# Patient Record
Sex: Female | Born: 1970 | Race: White | Hispanic: No | Marital: Single | Smoking: Current every day smoker
Health system: Southern US, Community
[De-identification: ages and names within clinical notes are randomized; demographics above are authoritative.]

---

## 2004-07-15 ENCOUNTER — Emergency Department: Payer: Self-pay | Admitting: Emergency Medicine

## 2005-07-15 ENCOUNTER — Other Ambulatory Visit: Payer: Self-pay

## 2005-07-15 ENCOUNTER — Emergency Department: Payer: Self-pay | Admitting: Emergency Medicine

## 2007-02-21 IMAGING — CR DG CHEST 2V
1 series · 2 of 2 positions shown · non-contrast
Comparison: none

REASON FOR EXAM: Chest pain
COMMENTS:

PROCEDURE:     DXR - DXR CHEST PA (OR AP) AND LATERAL  - July 15, 2005  [DATE]
RESULT:     The lungs are clear. The cardiac silhouette and visualized bony
skeleton are unremarkable.

[Series 1: view not recorded · 0.17mm/px · 2 of 2 slices shown]
[im 1/2]
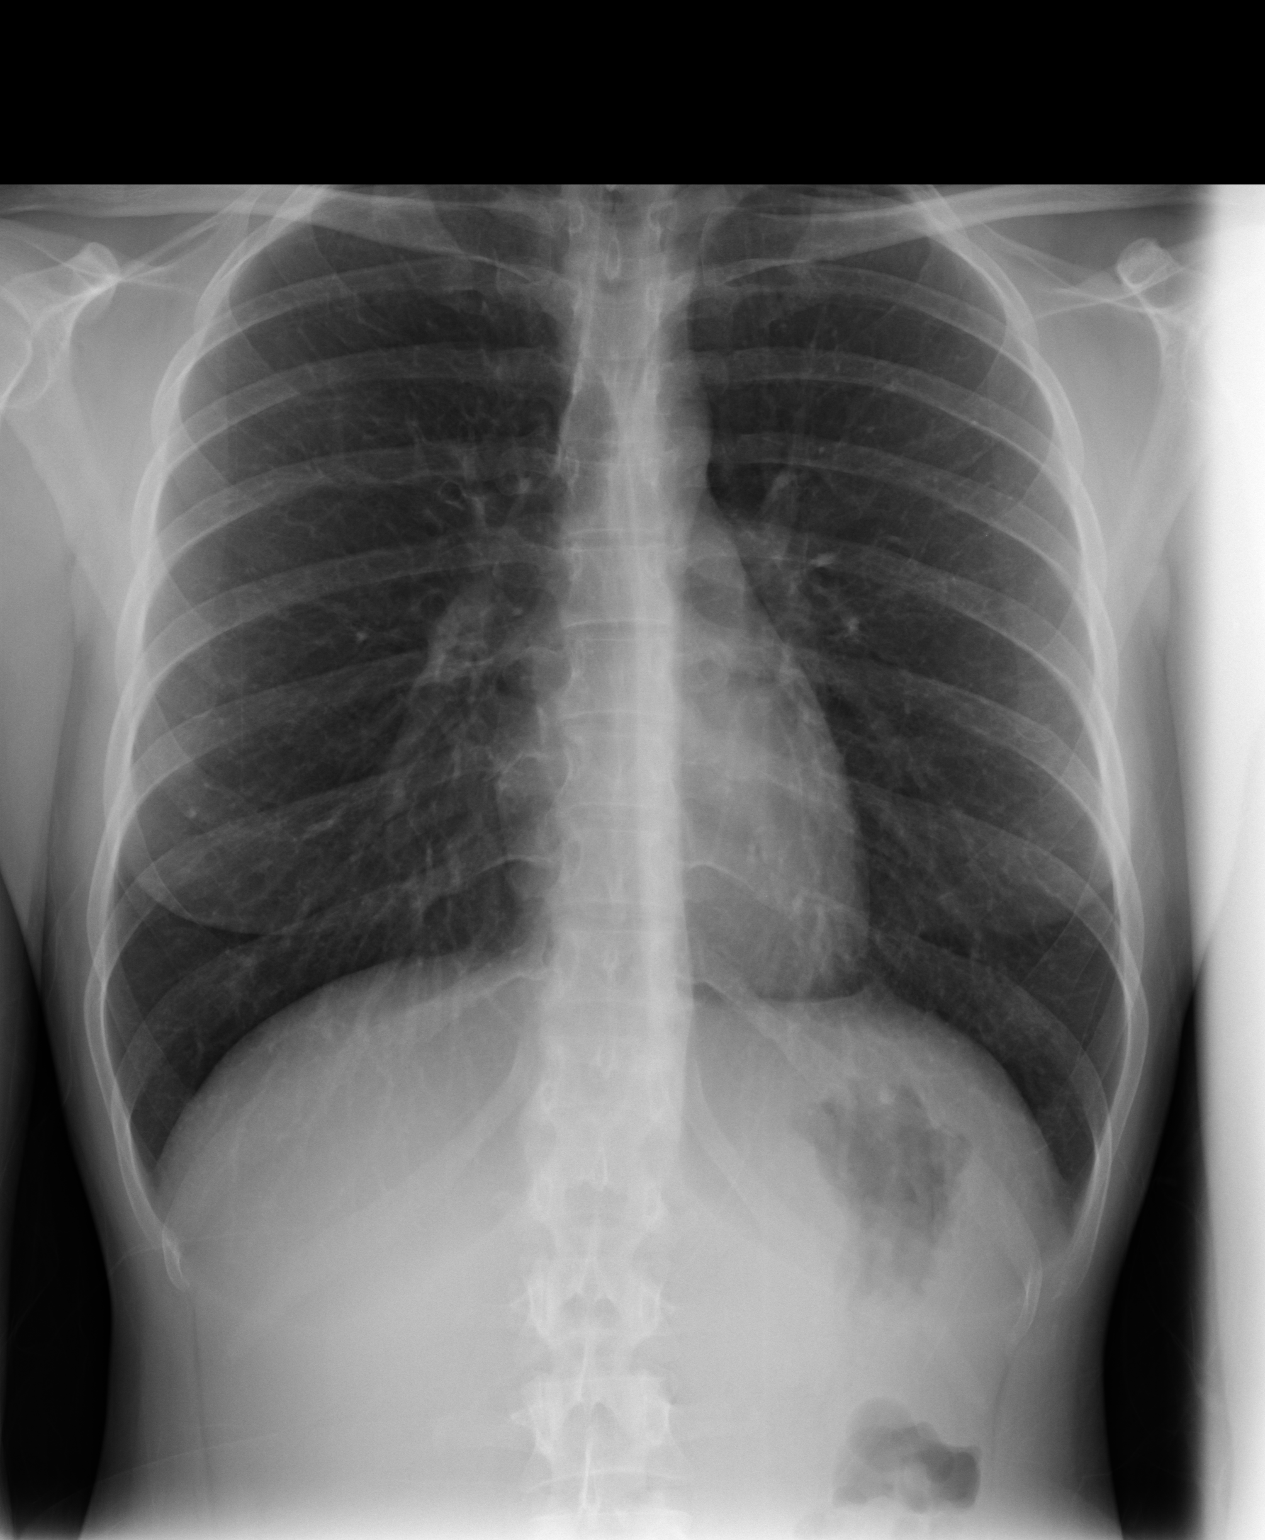
[im 2/2]
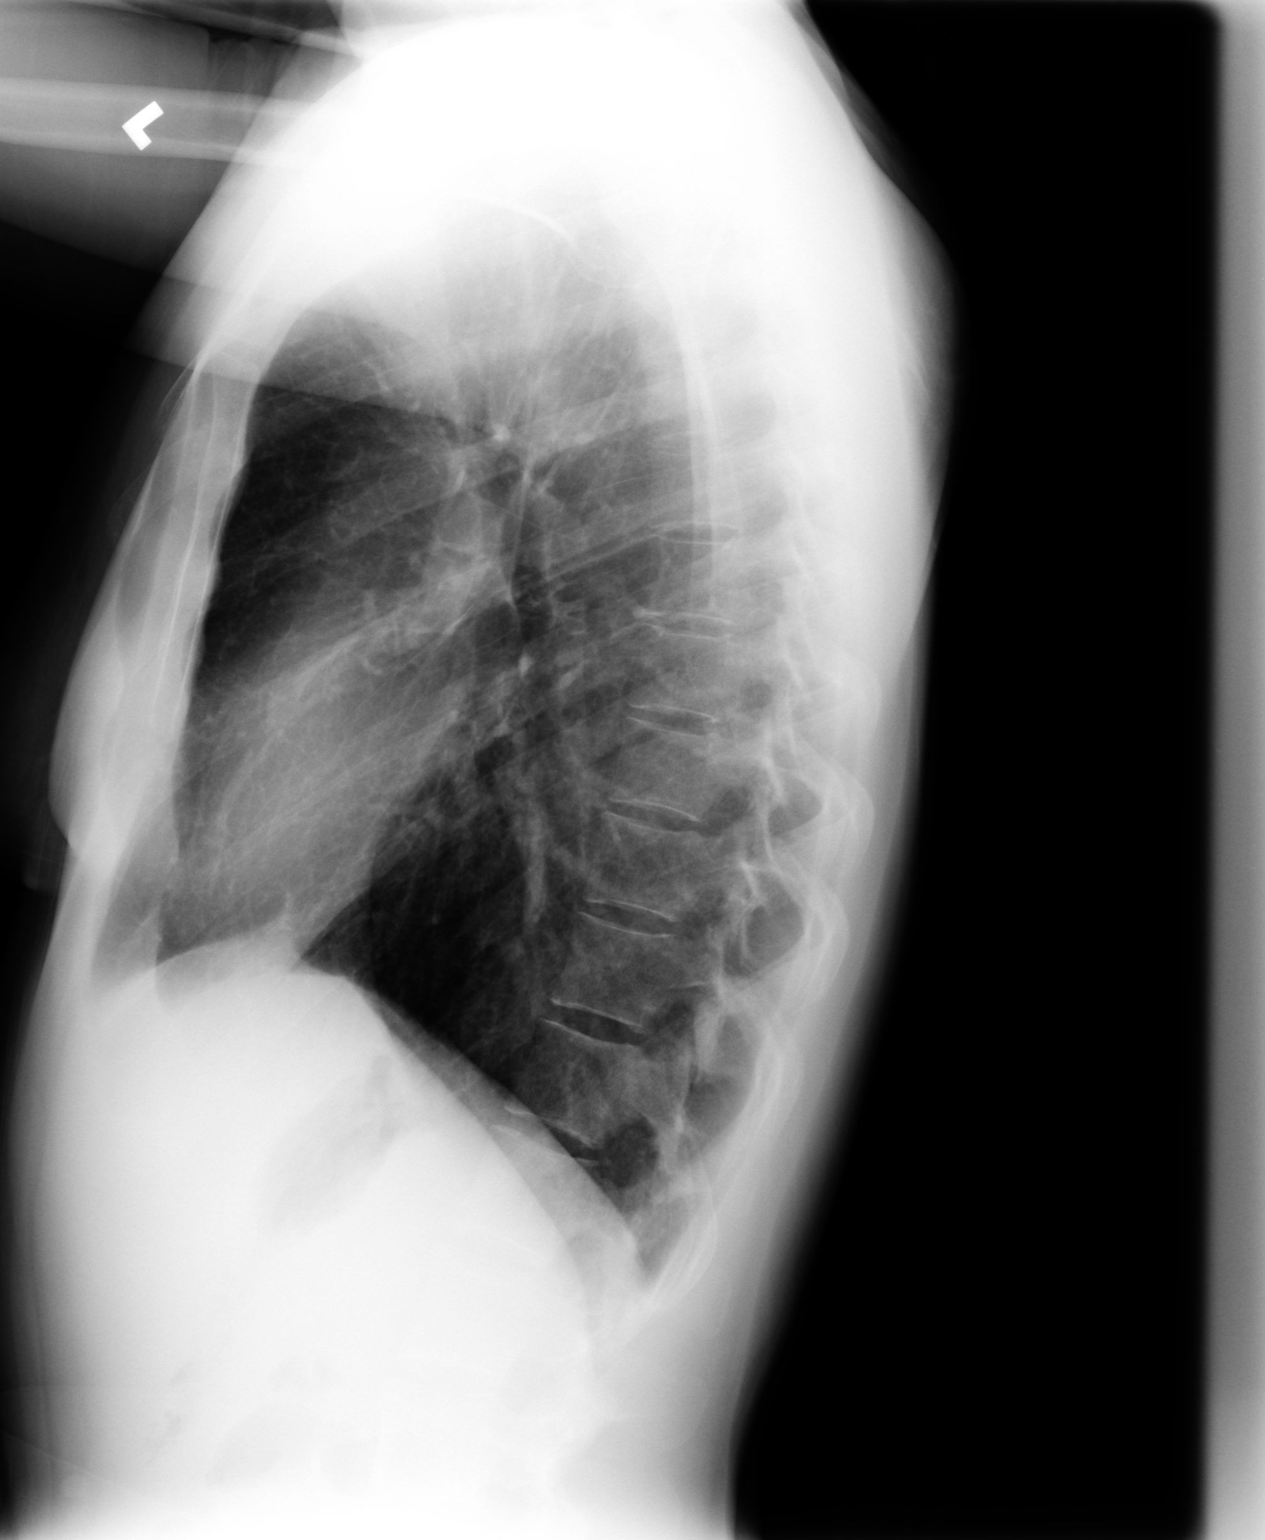

[2 of 2 positions shown; findings below may reference images not displayed]

IMPRESSION: Chest radiograph without evidence of acute cardiopulmonary
disease.

## 2008-05-24 ENCOUNTER — Emergency Department: Payer: Self-pay | Admitting: Internal Medicine

## 2008-12-13 ENCOUNTER — Emergency Department: Payer: Self-pay | Admitting: Unknown Physician Specialty

## 2008-12-24 ENCOUNTER — Emergency Department: Payer: Self-pay | Admitting: Emergency Medicine

## 2009-03-24 ENCOUNTER — Emergency Department: Payer: Self-pay | Admitting: Emergency Medicine

## 2011-07-14 ENCOUNTER — Emergency Department: Payer: Self-pay | Admitting: *Deleted

## 2012-06-18 ENCOUNTER — Emergency Department: Payer: Self-pay | Admitting: Emergency Medicine

## 2013-01-17 ENCOUNTER — Emergency Department: Payer: Self-pay | Admitting: Internal Medicine

## 2013-01-17 LAB — URINALYSIS, COMPLETE
Glucose,UR: NEGATIVE mg/dL (ref 0–75)
Ketone: NEGATIVE
Nitrite: POSITIVE
RBC,UR: 1 /HPF (ref 0–5)
Specific Gravity: 1.015 (ref 1.003–1.030)
Squamous Epithelial: 5

## 2013-01-17 LAB — CBC WITH DIFFERENTIAL/PLATELET
Basophil #: 0.1 10*3/uL (ref 0.0–0.1)
Basophil %: 1.7 %
HGB: 14.2 g/dL (ref 12.0–16.0)
Lymphocyte %: 28.3 %
MCH: 29.4 pg (ref 26.0–34.0)
MCHC: 34.7 g/dL (ref 32.0–36.0)
MCV: 85 fL (ref 80–100)
Monocyte %: 8.3 %
Neutrophil %: 58.7 %
RBC: 4.84 10*6/uL (ref 3.80–5.20)
RDW: 14.5 % (ref 11.5–14.5)
WBC: 8.3 10*3/uL (ref 3.6–11.0)

## 2013-01-17 LAB — COMPREHENSIVE METABOLIC PANEL
Alkaline Phosphatase: 52 U/L (ref 50–136)
BUN: 6 mg/dL — ABNORMAL LOW (ref 7–18)
Co2: 26 mmol/L (ref 21–32)
Creatinine: 0.95 mg/dL (ref 0.60–1.30)
EGFR (African American): >60
EGFR (Non-African Amer.): >60
Osmolality: 273 (ref 275–301)
Potassium: 3.5 mmol/L (ref 3.5–5.1)
SGPT (ALT): 21 U/L (ref 12–78)

## 2013-01-17 LAB — SEDIMENTATION RATE: Erythrocyte Sed Rate: 2 mm/hr (ref 0–20)

## 2015-06-11 ENCOUNTER — Emergency Department
Admission: EM | Admit: 2015-06-11 | Discharge: 2015-06-11 | Disposition: A | Discharge status: Home / Self Care | Payer: Self-pay | Attending: Emergency Medicine | Admitting: Emergency Medicine

## 2015-06-11 ENCOUNTER — Encounter: Payer: Self-pay | Admitting: *Deleted

## 2015-06-11 DIAGNOSIS — B029 Zoster without complications: Secondary | ICD-10-CM | POA: Insufficient documentation

## 2015-06-11 MED ORDER — FAMCICLOVIR 500 MG PO TABS: 500.0000 mg | ORAL_TABLET | Freq: Three times a day (TID) | ORAL | Status: AC

## 2015-06-11 MED ORDER — HYDROCODONE-ACETAMINOPHEN 5-325 MG PO TABS: 1.0000 | ORAL_TABLET | ORAL | Status: AC | PRN

## 2015-06-11 NOTE — ED Notes (Signed)
Pt states burning area on her right butt cheek, area blistered upon assessment

## 2015-06-11 NOTE — ED Notes (Signed)
C/o painful blistering to right side of buttocks, states she get shingles every year around this time

## 2015-06-11 NOTE — ED Provider Notes (Signed)
[facility] Emergency Department Provider Note  ____________________________________________  Time seen: Approximately 12:32 PM  I have reviewed the triage vital signs and the nursing notes.   HISTORY  Chief Complaint Blister    HPI April Welch is a 44 y.o. female who presents emergency department complaining of a extremely painful rash to right buttocks. She states that she had shingles in the past and symptoms are the same. She states the pain is a burning/sharp sensation. She states initially there was some redness in a linear distribution and is now started to "blister." No other complaints and no other areas of rash.   History reviewed. No pertinent past medical history.  There are no active problems to display for this patient.   No past surgical history on file.  Current Outpatient Rx  Name  Route  Sig  Dispense  Refill  . famciclovir (FAMVIR) 500 MG tablet   Oral   Take 1 tablet (500 mg total) by mouth 3 (three) times daily.   21 tablet   0   . HYDROcodone-acetaminophen (NORCO/VICODIN) 5-325 MG tablet   Oral   Take 1 tablet by mouth every 4 (four) hours as needed for moderate pain.   20 tablet   0     Allergies Codeine  History reviewed. No pertinent family history.  Social History Social History  Substance Use Topics  . Smoking status: None  . Smokeless tobacco: None  . Alcohol Use: None    Review of Systems Constitutional: No fever/chills Eyes: No visual changes. ENT: No sore throat. Cardiovascular: Denies chest pain. Respiratory: Denies shortness of breath. Gastrointestinal: No abdominal pain.  No nausea, no vomiting.  No diarrhea.  No constipation. Genitourinary: Negative for dysuria. Musculoskeletal: Negative for back pain. Skin: Endorses a rash to right buttocks. Neurological: Negative for headaches, focal weakness or numbness.  10-point ROS otherwise  negative.  ____________________________________________   PHYSICAL EXAM:  VITAL SIGNS: ED Triage Vitals  Enc Vitals Group     BP 06/11/15 1114 122/72 mmHg     Pulse Rate 06/11/15 1114 108     Resp 06/11/15 1114 18     Temp 06/11/15 1114 98.5 F (36.9 C)     Temp Source 06/11/15 1114 Oral     SpO2 06/11/15 1114 100 %     Weight 06/11/15 1114 100 lb (45.36 kg)     Height 06/11/15 1114 5' (1.524 m)     Head Cir --      Peak Flow --      Pain Score 06/11/15 1115 10     Pain Loc --      Pain Edu? --      Excl. in GC? --     Constitutional: Alert and oriented. Well appearing and in no acute distress. Eyes: Conjunctivae are normal. PERRL. EOMI. Head: Atraumatic. Nose: No congestion/rhinnorhea. Mouth/Throat: Mucous membranes are moist.  Oropharynx non-erythematous. Neck: No stridor.   Cardiovascular: Normal rate, regular rhythm. Grossly normal heart sounds.  Good peripheral circulation. Respiratory: Normal respiratory effort.  No retractions. Lungs CTAB. Gastrointestinal: Soft and nontender. No distention. No abdominal bruits. No CVA tenderness. Musculoskeletal: No lower extremity tenderness nor edema.  No joint effusions. Neurologic:  Normal speech and language. No gross focal neurologic deficits are appreciated. No gait instability. Skin:  Skin is warm, dry and intact. Blistering rash noted in dermatomal distribution on right buttocks. Psychiatric: Mood and affect are normal. Speech and behavior are normal.  ____________________________________________   LABS (all labs ordered  are listed, but only abnormal results are displayed)  Labs Reviewed - No data to display ____________________________________________  EKG   ____________________________________________  RADIOLOGY   ____________________________________________   PROCEDURES  Procedure(s) performed: None  Critical Care performed: No  ____________________________________________   INITIAL IMPRESSION /  ASSESSMENT AND PLAN / ED COURSE  Pertinent labs & imaging results that were available during my care of the patient were reviewed by me and considered in my medical decision making (see chart for details).  Patient's diagnosis is consistent with shingles. Patient will be placed on antivirals and pain medicine. She is advised to follow-up with primary care for symptoms persisting pass this treatment course. Patient verbalizes understanding of diagnosis and treatment and verbalizes compliance with same.     Discharge Medication List as of 06/11/2015 12:51 PM    START taking these medications   Details  famciclovir (FAMVIR) 500 MG tablet Take 1 tablet (500 mg total) by mouth 3 (three) times daily., Starting 06/11/2015, Until Discontinued, Print    HYDROcodone-acetaminophen (NORCO/VICODIN) 5-325 MG tablet Take 1 tablet by mouth every 4 (four) hours as needed for moderate pain., Starting 06/11/2015, Until Discontinued, Print        ____________________________________________   FINAL CLINICAL IMPRESSION(S) / ED DIAGNOSES  Final diagnoses:  Shingles      Racheal Patches, PA-C 06/11/15 1302  Rockne Menghini, MD 06/11/15 1552

## 2015-06-11 NOTE — Discharge Instructions (Signed)

## 2018-09-05 ENCOUNTER — Other Ambulatory Visit: Payer: Self-pay

## 2018-09-05 ENCOUNTER — Emergency Department: Payer: Self-pay

## 2018-09-05 ENCOUNTER — Emergency Department
Admission: EM | Admit: 2018-09-05 | Discharge: 2018-09-05 | Disposition: A | Discharge status: Home / Self Care | Payer: Self-pay | Attending: Emergency Medicine | Admitting: Emergency Medicine

## 2018-09-05 ENCOUNTER — Encounter: Payer: Self-pay | Admitting: Emergency Medicine

## 2018-09-05 DIAGNOSIS — M79601 Pain in right arm: Secondary | ICD-10-CM

## 2018-09-05 DIAGNOSIS — M79602 Pain in left arm: Secondary | ICD-10-CM | POA: Insufficient documentation

## 2018-09-05 DIAGNOSIS — Z79899 Other long term (current) drug therapy: Secondary | ICD-10-CM | POA: Insufficient documentation

## 2018-09-05 DIAGNOSIS — F17200 Nicotine dependence, unspecified, uncomplicated: Secondary | ICD-10-CM | POA: Insufficient documentation

## 2018-09-05 MED ORDER — MELOXICAM 15 MG PO TABS: 15.0000 mg | ORAL_TABLET | Freq: Every day | ORAL | 0 refills | Status: AC

## 2018-09-05 NOTE — ED Notes (Signed)
Pt states knot noted to left arm and upper arm

## 2018-09-05 NOTE — ED Triage Notes (Signed)
Pt here for "knot" to left arm.  Reports small knot to left forearm and left upper arm.  RN unable to palpate knot to upper arm. Difficult to feel knot to lower arm.  No fevers. Pt denies any other symptoms other than the knots and pain at the site of them.  No signs of phlebitis or DVT.  No recent travel. Pt denies medical hx.

## 2018-09-05 NOTE — ED Provider Notes (Signed)
[facility] Emergency Department Provider Note  ____________________________________________  Time seen: Approximately 3:24 PM  I have reviewed the triage vital signs and the nursing notes.   HISTORY  Chief Complaint knot to left arm    HPI April Welch is a 48 y.o. female who presents the emergency department complaining of left upper extremity  pain and 2 lesions, 1 to the upper, medial aspect and one to the anterior forearm.  Patient reports that the lesions are very tender to palpation.  She denies any direct trauma to the left upper extremity.  She denies any neck pain.  No edema or erythema.  Patient denies any radicular symptoms in the hand.  No medications for his complaint prior to arrival.  No history of recurrent skin lesions.  No medical history.  No chronic medications.   History reviewed. No pertinent past medical history.  There are no active problems to display for this patient.   Past Surgical History:  Procedure Laterality Date  . CESAREAN SECTION      Prior to Admission medications   Medication Sig Start Date End Date Taking? Authorizing Provider  famciclovir (FAMVIR) 500 MG tablet Take 1 tablet (500 mg total) by mouth 3 (three) times daily. 06/11/15   Cuthriell, Delorise Royals, PA-C  HYDROcodone-acetaminophen (NORCO/VICODIN) 5-325 MG tablet Take 1 tablet by mouth every 4 (four) hours as needed for moderate pain. 06/11/15   Cuthriell, Delorise Royals, PA-C  meloxicam (MOBIC) 15 MG tablet Take 1 tablet (15 mg total) by mouth daily. 09/05/18   Cuthriell, Delorise Royals, PA-C    Allergies Codeine  History reviewed. No pertinent family history.  Social History Social History   Tobacco Use  . Smoking status: Current Every Day Smoker  . Smokeless tobacco: Never Used  Substance Use Topics  . Alcohol use: Yes    Frequency: Never  . Drug use: Never     Review of Systems  Constitutional: No fever/chills Eyes: No visual changes.   Cardiovascular: no chest pain. Respiratory: no cough. No SOB. Gastrointestinal: No abdominal pain.  No nausea, no vomiting.  Musculoskeletal: Positive for painful left upper extremity with 2 appreciable lesions. Skin: Negative for rash, abrasions, lacerations, ecchymosis. Neurological: Negative for headaches, focal weakness or numbness. 10-point ROS otherwise negative.  ____________________________________________   PHYSICAL EXAM:  VITAL SIGNS: ED Triage Vitals  Enc Vitals Group     BP 09/05/18 1427 106/70     Pulse Rate 09/05/18 1427 75     Resp 09/05/18 1427 16     Temp 09/05/18 1427 98.1 F (36.7 C)     Temp Source 09/05/18 1427 Oral     SpO2 09/05/18 1427 98 %     Weight 09/05/18 1427 100 lb (45.4 kg)     Height 09/05/18 1427 5' (1.524 m)     Head Circumference --      Peak Flow --      Pain Score 09/05/18 1426 8     Pain Loc --      Pain Edu? --      Excl. in GC? --      Constitutional: Alert and oriented. Well appearing and in no acute distress. Eyes: Conjunctivae are normal. PERRL. EOMI. Head: Atraumatic. Neck: No stridor.    Cardiovascular: Normal rate, regular rhythm. Normal S1 and S2.  Good peripheral circulation. Respiratory: Normal respiratory effort without tachypnea or retractions. Lungs CTAB. Good air entry to the bases with no decreased or absent breath sounds. Musculoskeletal: Full range of  motion to all extremities. No gross deformities appreciated.  Visualization of the left upper extremity reveals no gross erythema, edema.  Full range of motion to the shoulder, elbow, wrist.  On exam, I do appreciate a palpable lesion anterior forearm.  I do not appreciate the lesion in the upper aspect of the arm.  On palpation, patient is very tender to palpation over the palpable lesion forearm.  No overlying skin changes.  No ecchymosis.  Radial pulse intact distally.  Sensation intact all digits distally. Neurologic:  Normal speech and language. No gross focal  neurologic deficits are appreciated.  Skin:  Skin is warm, dry and intact. No rash noted. Psychiatric: Mood and affect are normal. Speech and behavior are normal. Patient exhibits appropriate insight and judgement.   ____________________________________________   LABS (all labs ordered are listed, but only abnormal results are displayed)  Labs Reviewed - No data to display ____________________________________________  EKG   ____________________________________________  RADIOLOGY I personally viewed and evaluated these images as part of my medical decision making, as well as reviewing the written report by the radiologist.  US Venous Img Upper Uni Left  Result Date: 09/05/2018 CLINICAL DATA:  Right forearm pain and palpable abnormality. EXAM: RIGHT UPPER EXTREMITY VENOUS DOPPLER ULTRASOUND TECHNIQUE: Gray-scale sonography with graded compression, as well as color Doppler and duplex ultrasound were performed to evaluate the upper extremity deep venous system from the level of the subclavian vein and including the jugular, axillary, basilic, radial, ulnar and upper cephalic vein. Spectral Doppler was utilized to evaluate flow at rest and with distal augmentation maneuvers. COMPARISON:  None. FINDINGS: Contralateral Subclavian Vein: Respiratory phasicity is normal and symmetric with the symptomatic side. No evidence of thrombus. Normal compressibility. Internal Jugular Vein: No evidence of thrombus. Normal compressibility, respiratory phasicity and response to augmentation. Subclavian Vein: No evidence of thrombus. Normal compressibility, respiratory phasicity and response to augmentation. Axillary Vein: No evidence of thrombus. Normal compressibility, respiratory phasicity and response to augmentation. Cephalic Vein: No evidence of thrombus. Normal compressibility, respiratory phasicity and response to augmentation. Basilic Vein: No evidence of thrombus. Normal compressibility, respiratory  phasicity and response to augmentation. Brachial Veins: No evidence of thrombus. Normal compressibility, respiratory phasicity and response to augmentation. Radial Veins: No evidence of thrombus. Normal compressibility, respiratory phasicity and response to augmentation. Ulnar Veins: No evidence of thrombus. Normal compressibility, respiratory phasicity and response to augmentation. Venous Reflux:  None visualized. Other Findings: No evidence of superficial thrombophlebitis or abnormal fluid collection. In the region of palpable abnormality directed by the patient in the left forearm, no soft tissue mass or vascular abnormality is identified. IMPRESSION: No evidence of DVT, superficial thrombophlebitis or mass within the right upper extremity. Electronically Signed   By: Irish Lack M.D.   On: 09/05/2018 17:03    ____________________________________________    PROCEDURES  Procedure(s) performed:    Procedures    Medications - No data to display   ____________________________________________   INITIAL IMPRESSION / ASSESSMENT AND PLAN / ED COURSE  Pertinent labs & imaging results that were available during my care of the patient were reviewed by me and considered in my medical decision making (see chart for details).  Review of the [place] CSRS was performed in accordance of the [facility] prior to dispensing any controlled drugs.  Clinical Course as of Sep 05 1710  Wed Sep 05, 2018  1532 Patient presents emergency department with pain to the left arm as well as to palpable knots.  I appreciate 1 of the  palpable lesions to the forearm.  At this time, there is no indication of radicular symptoms.  No indication of arterial occlusion.  On exam, differential includes hematoma, phlebitis, DVT, lipoma.  Patient will be evaluated with a venous ultrasound to ensure no thrombus.  No labs at this time.   [JC]    Clinical Course User Index [JC] Cuthriell, Delorise RoyalsJonathan D, PA-C     Patient's diagnosis is  consistent with right arm pain.  Patient presents emergency department with complaints of right arm pain as well as a lesion to the right forearm and upper arm.  I was unable able to appreciate a lesion described in the upper arm but was able to palpate a lesion in the forearm.  Initial differential included muscle strain, phlebitis, DVT, lipoma.  Imaging reveals no DVT or related skin changes.  At this point patient will be instructed to use warm compress to the area as well as anti-inflammatories.  Patient is given prescription for meloxicam.  Follow-up with primary care as needed.. Patient is given ED precautions to return to the ED for any worsening or new symptoms.     ____________________________________________  FINAL CLINICAL IMPRESSION(S) / ED DIAGNOSES  Final diagnoses:  Pain of right upper extremity      NEW MEDICATIONS STARTED DURING THIS VISIT:  ED Discharge Orders         Ordered    meloxicam (MOBIC) 15 MG tablet  Daily     09/05/18 1711              This chart was dictated using voice recognition software/Dragon. Despite best efforts to proofread, errors can occur which can change the meaning. Any change was purely unintentional.    Racheal PatchesCuthriell, Jonathan D, PA-C 09/05/18 1712    Phineas SemenGoodman, Gray[place], MD 09/05/18 [phone]

## 2020-04-13 IMAGING — US US EXTREM  UP VENOUS*L*
1 series · 13 of 24 positions shown · non-contrast
Comparison: None.

CLINICAL DATA: Right forearm pain and palpable abnormality.



[Series 1: us extrem up venous*left* · 13 of 34 slices shown]
[im 1/34]
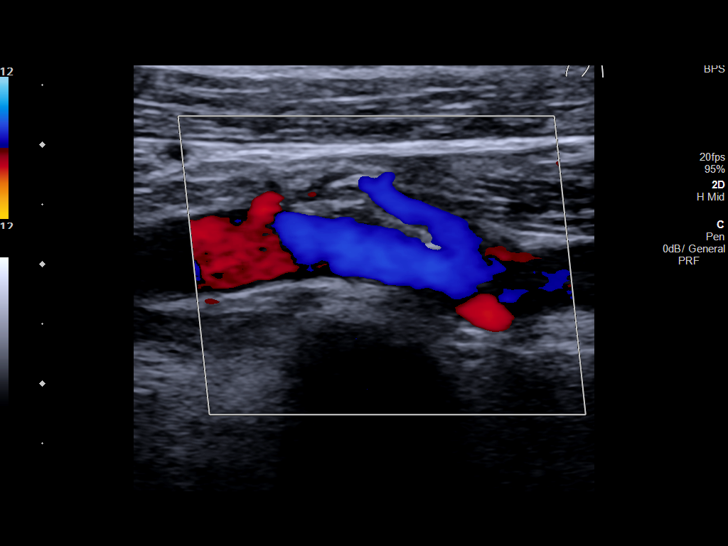
[im 3/34]
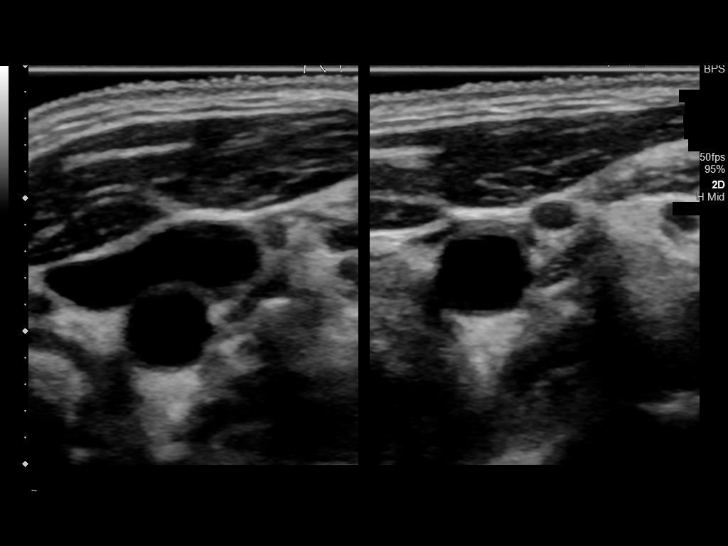
[im 6/34]
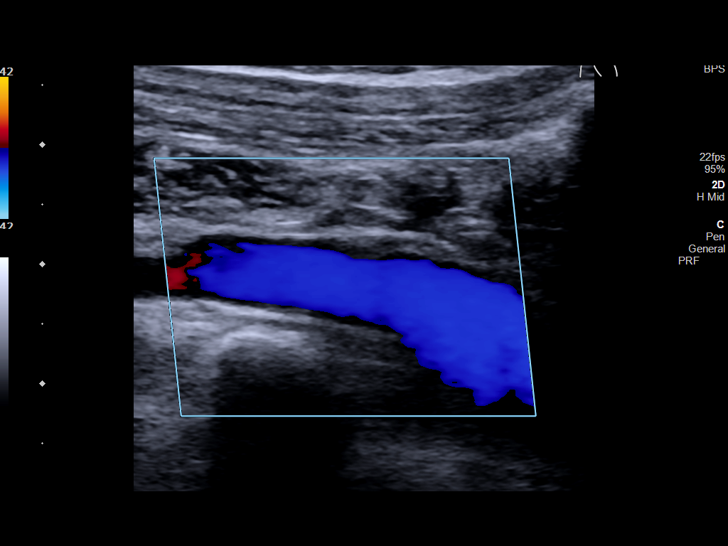
[im 9/34]
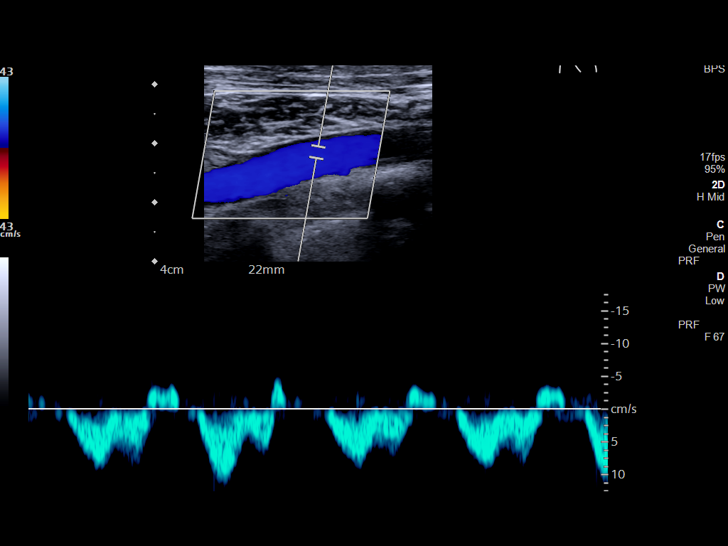
[im 12/34]
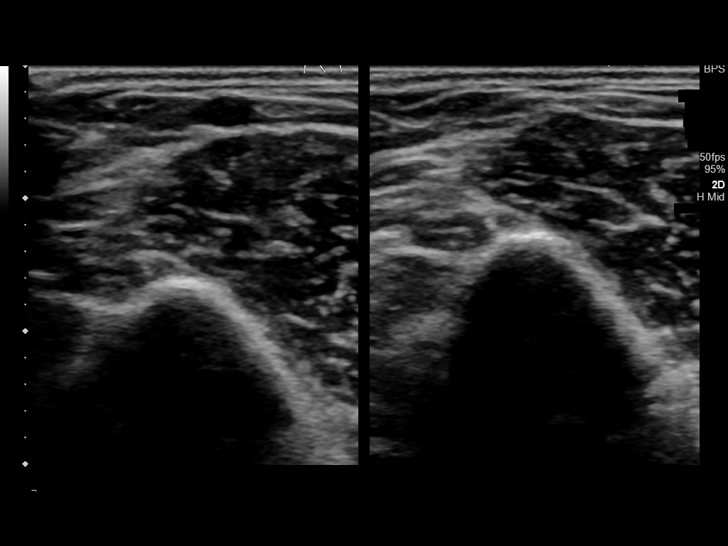
[im 15/34]
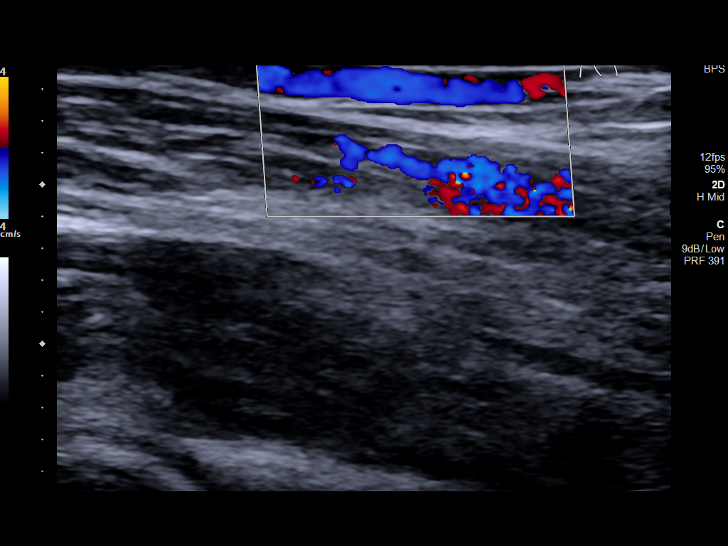
[im 18/34]
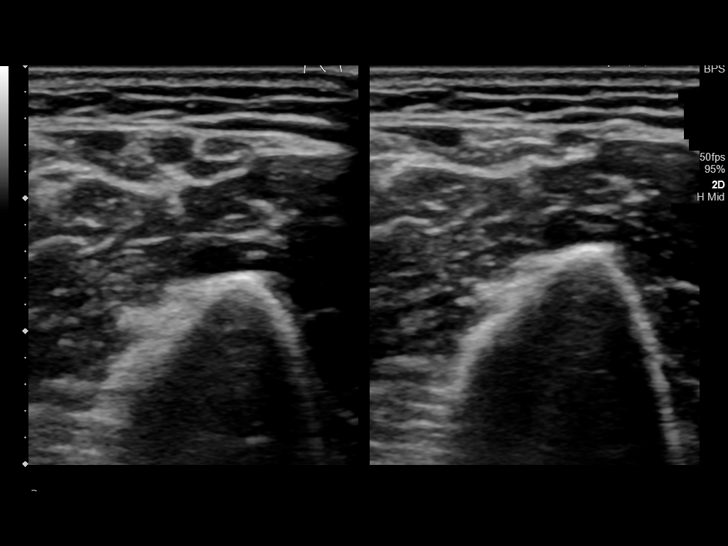
[im 19/34]
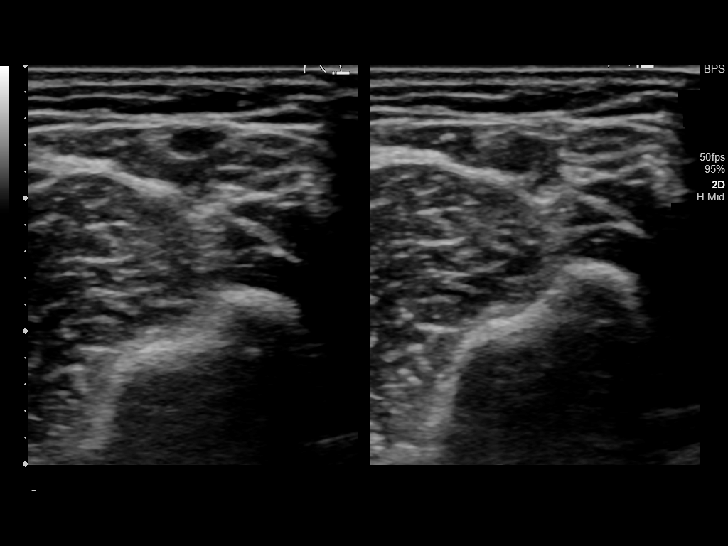
[im 22/34]
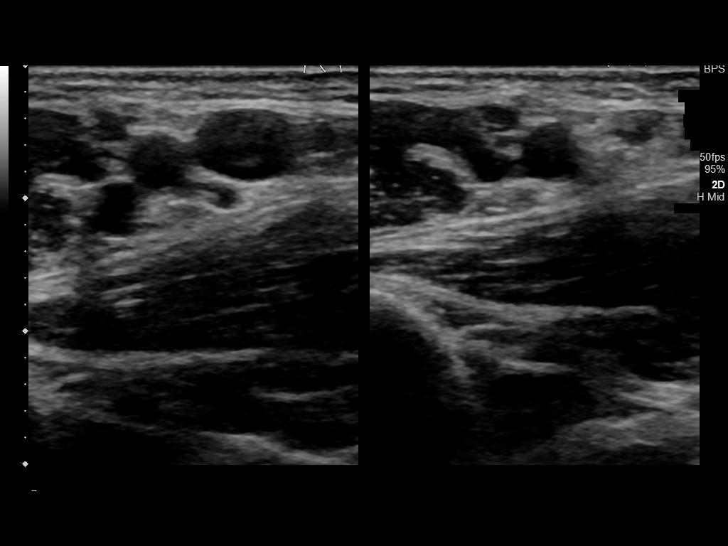
[im 25/34]
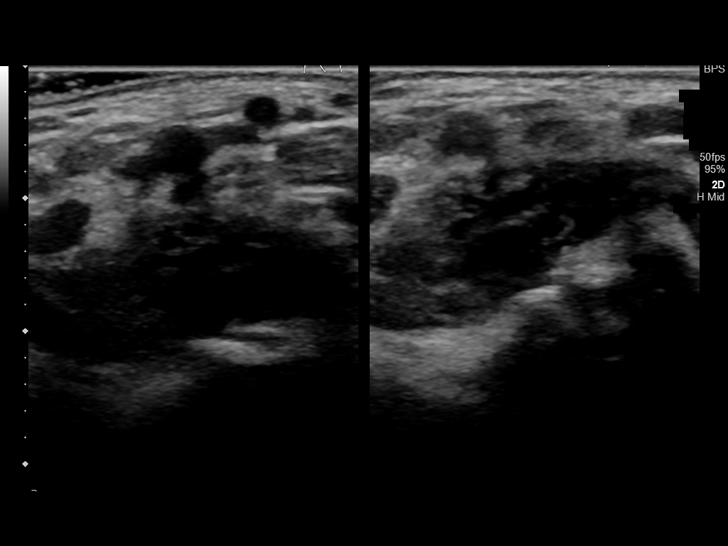
[im 28/34]
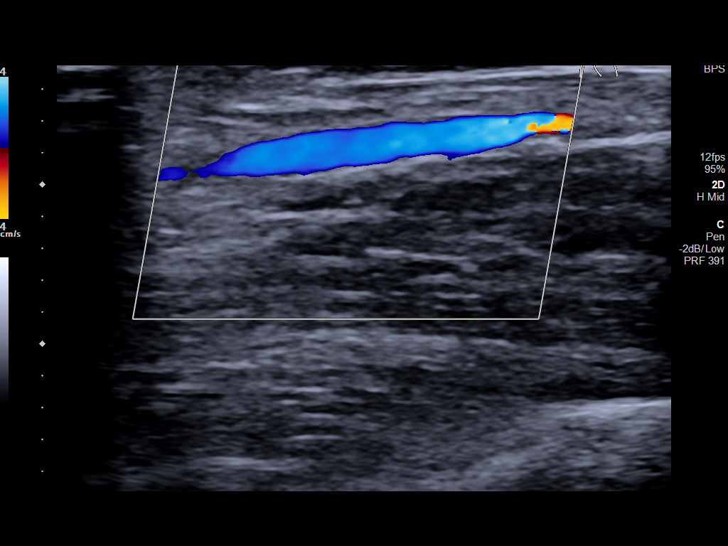
[im 31/34]
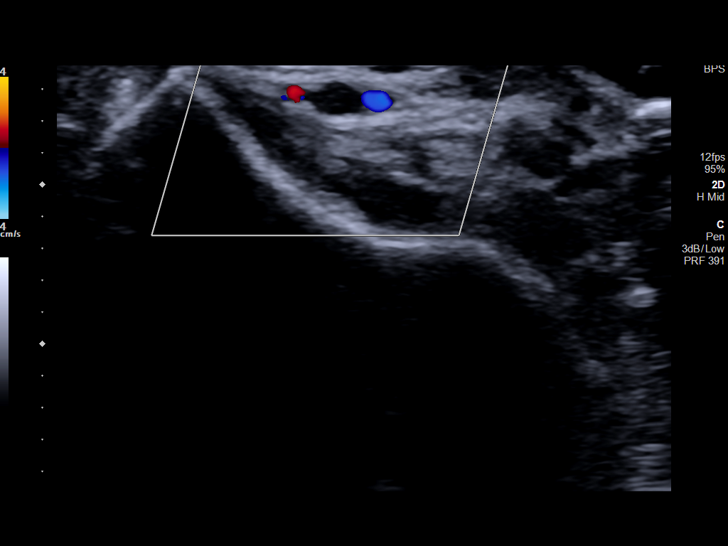
[im 34/34]
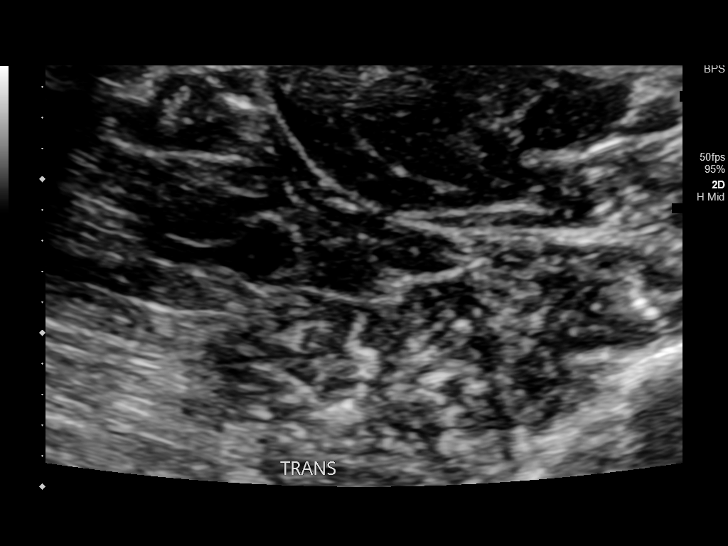

[13 of 24 positions shown; findings below may reference images not displayed]

FINDINGS: Contralateral Subclavian Vein: Respiratory phasicity is normal and
symmetric with the symptomatic side. No evidence of thrombus. Normal
compressibility.

Internal Jugular Vein: No evidence of thrombus. Normal
compressibility, respiratory phasicity and response to augmentation.

Subclavian Vein: No evidence of thrombus. Normal compressibility,
respiratory phasicity and response to augmentation.

Axillary Vein: No evidence of thrombus. Normal compressibility,
respiratory phasicity and response to augmentation.

Cephalic Vein: No evidence of thrombus. Normal compressibility,
respiratory phasicity and response to augmentation.

Basilic Vein: No evidence of thrombus. Normal compressibility,
respiratory phasicity and response to augmentation.

Brachial Veins: No evidence of thrombus. Normal compressibility,
respiratory phasicity and response to augmentation.

Radial Veins: No evidence of thrombus. Normal compressibility,
respiratory phasicity and response to augmentation.

Ulnar Veins: No evidence of thrombus. Normal compressibility,
respiratory phasicity and response to augmentation.

Venous Reflux:  None visualized.

Other Findings: No evidence of superficial thrombophlebitis or
abnormal fluid collection. In the region of palpable abnormality
directed by the patient in the left forearm, no soft tissue mass or
vascular abnormality is identified.
IMPRESSION: No evidence of DVT, superficial thrombophlebitis or mass within the
right upper extremity.

## 2021-02-15 ENCOUNTER — Emergency Department
Admission: EM | Admit: 2021-02-15 | Discharge: 2021-02-15 | Disposition: A | Discharge status: Home / Self Care | Payer: Self-pay | Attending: Emergency Medicine | Admitting: Emergency Medicine

## 2021-02-15 ENCOUNTER — Emergency Department: Payer: Self-pay

## 2021-02-15 ENCOUNTER — Other Ambulatory Visit: Payer: Self-pay

## 2021-02-15 DIAGNOSIS — F17200 Nicotine dependence, unspecified, uncomplicated: Secondary | ICD-10-CM | POA: Insufficient documentation

## 2021-02-15 DIAGNOSIS — M549 Dorsalgia, unspecified: Secondary | ICD-10-CM | POA: Insufficient documentation

## 2021-02-15 DIAGNOSIS — M79605 Pain in left leg: Secondary | ICD-10-CM | POA: Insufficient documentation

## 2021-02-15 DIAGNOSIS — R109 Unspecified abdominal pain: Secondary | ICD-10-CM | POA: Insufficient documentation

## 2021-02-15 LAB — CBC
HCT: 43.9 % (ref 36.0–46.0)
Hemoglobin: 15.7 g/dL — ABNORMAL HIGH (ref 12.0–15.0)
MCH: 32.4 pg (ref 26.0–34.0)
MCHC: 35.8 g/dL (ref 30.0–36.0)
MCV: 90.5 fL (ref 80.0–100.0)
Platelets: 261 10*3/uL (ref 150–400)
RBC: 4.85 MIL/uL (ref 3.87–5.11)
RDW: 13.2 % (ref 11.5–15.5)
WBC: 6.3 10*3/uL (ref 4.0–10.5)
nRBC: 0 % (ref 0.0–0.2)

## 2021-02-15 LAB — URINALYSIS, COMPLETE (UACMP) WITH MICROSCOPIC
Bacteria, UA: NONE SEEN
Bilirubin Urine: NEGATIVE
Glucose, UA: NEGATIVE mg/dL
Hgb urine dipstick: NEGATIVE
Ketones, ur: NEGATIVE mg/dL
Leukocytes,Ua: NEGATIVE
Nitrite: NEGATIVE
Protein, ur: NEGATIVE mg/dL
Specific Gravity, Urine: 1.003 — ABNORMAL LOW (ref 1.005–1.030)
pH: 7 (ref 5.0–8.0)

## 2021-02-15 LAB — COMPREHENSIVE METABOLIC PANEL
ALT: 25 U/L (ref 0–44)
AST: 24 U/L (ref 15–41)
Albumin: 4.3 g/dL (ref 3.5–5.0)
Alkaline Phosphatase: 77 U/L (ref 38–126)
Anion gap: 10 (ref 5–15)
BUN: 10 mg/dL (ref 6–20)
CO2: 25 mmol/L (ref 22–32)
Calcium: 9.3 mg/dL (ref 8.9–10.3)
Chloride: 105 mmol/L (ref 98–111)
Creatinine, Ser: 0.83 mg/dL (ref 0.44–1.00)
GFR, Estimated: >60 mL/min (ref >60)
Glucose, Bld: 94 mg/dL (ref 70–99)
Potassium: 3.8 mmol/L (ref 3.5–5.1)
Sodium: 140 mmol/L (ref 135–145)
Total Bilirubin: 0.5 mg/dL (ref 0.3–1.2)
Total Protein: 7.3 g/dL (ref 6.5–8.1)

## 2021-02-15 LAB — POC URINE PREG, ED: Preg Test, Ur: NEGATIVE

## 2021-02-15 LAB — LIPASE, BLOOD: Lipase: 37 U/L (ref 11–51)

## 2021-02-15 MED ORDER — KETOROLAC TROMETHAMINE 30 MG/ML IJ SOLN
15.0000 mg | Freq: Once | INTRAMUSCULAR | Status: AC
  Administered 2021-02-15: 15 mg via INTRAVENOUS
  Filled 2021-02-15: qty 1

## 2021-02-15 MED ORDER — CYCLOBENZAPRINE HCL 5 MG PO TABS
5.0000 mg | ORAL_TABLET | Freq: Three times a day (TID) | ORAL | 0 refills | Status: AC | PRN
Start: 2021-02-15 — End: ?

## 2021-02-15 MED ORDER — SODIUM CHLORIDE 0.9 % IV BOLUS
1000.0000 mL | Freq: Once | INTRAVENOUS | Status: AC
  Administered 2021-02-15: 1000 mL via INTRAVENOUS

## 2021-02-15 NOTE — ED Triage Notes (Signed)
First Nurse Note:  Arrives with c/o LLflank/ back pain.  Pain started this morning.  Pain worse with coughing and deep breathing.    Ambulates with easy and steady gait.  Posture upright and relaxed.  No SOB/ DOE.  NAD

## 2021-02-15 NOTE — ED Triage Notes (Signed)
Pt here with left side flank pain that started last night. Pt states that pain starts on her left side and travels down her leg. Pt denies N/V/D.

## 2021-02-15 NOTE — ED Notes (Signed)
See triage note, pt reports left flank pain that started this am radiating down leg. Reports burning with last urination. NAD noted

## 2021-02-15 NOTE — ED Provider Notes (Signed)
[facility] Emergency Department Provider Note  Time seen: 9:36 AM  I have reviewed the triage vital signs and the nursing notes.   HISTORY  Chief Complaint Flank Pain   HPI April Welch is a 50 y.o. female with no significant past medical history presents to the emergency department for left flank pain.  According to the patient since last night she has been experiencing sharp pain in her left back radiating to her left abdomen and occasionally down her left leg.  Patient states she was lifting her grandchild yesterday and could have injured her back.  Patient denies any history of kidney stones.  No hematuria or dysuria.  No fever nausea vomiting diarrhea.  Describes her pain as moderate 6/10 sharp pain currently.   No past medical history on file.  There are no problems to display for this patient.   Past Surgical History:  Procedure Laterality Date   CESAREAN SECTION      Prior to Admission medications   Medication Sig Start Date End Date Taking? Authorizing Provider  famciclovir (FAMVIR) 500 MG tablet Take 1 tablet (500 mg total) by mouth 3 (three) times daily. 06/11/15   Cuthriell, Delorise Royals, PA-C  HYDROcodone-acetaminophen (NORCO/VICODIN) 5-325 MG tablet Take 1 tablet by mouth every 4 (four) hours as needed for moderate pain. 06/11/15   Cuthriell, Delorise Royals, PA-C  meloxicam (MOBIC) 15 MG tablet Take 1 tablet (15 mg total) by mouth daily. 09/05/18   Cuthriell, Delorise Royals, PA-C    Allergies  Allergen Reactions   Codeine     No family history on file.  Social History Social History   Tobacco Use   Smoking status: Every Day   Smokeless tobacco: Never  Substance Use Topics   Alcohol use: Yes   Drug use: Never    Review of Systems Constitutional: Negative for fever. Cardiovascular: Negative for chest pain. Respiratory: Negative for shortness of breath. Gastrointestinal: Left flank pain.  Negative for nausea vomiting or  diarrhea. Genitourinary: Negative for urinary compaints Musculoskeletal: Negative for musculoskeletal complaints Neurological: Negative for headache All other ROS negative  ____________________________________________   PHYSICAL EXAM:  VITAL SIGNS: ED Triage Vitals  Enc Vitals Group     BP 02/15/21 0902 126/83     Pulse Rate 02/15/21 0901 98     Resp 02/15/21 0901 18     Temp 02/15/21 0901 98.2 F (36.8 C)     Temp Source 02/15/21 0901 Oral     SpO2 02/15/21 0901 98 %     Weight 02/15/21 0901 100 lb (45.4 kg)     Height 02/15/21 0901 5' (1.524 m)     Head Circumference --      Peak Flow --      Pain Score 02/15/21 0901 6     Pain Loc --      Pain Edu? --      Excl. in GC? --    Constitutional: Alert and oriented. Well appearing and in no distress. Eyes: Normal exam ENT      Head: Normocephalic and atraumatic.      Mouth/Throat: Mucous membranes are moist. Cardiovascular: Normal rate, regular rhythm.  Respiratory: Normal respiratory effort without tachypnea nor retractions. Breath sounds are clear Gastrointestinal: Soft and nontender. No distention.  No CVA tenderness. Musculoskeletal: Nontender with normal range of motion in all extremities.  Moderate left SI joint tenderness. Neurologic:  Normal speech and language. No gross focal neurologic deficits Skin:  Skin is warm, dry and intact.  Psychiatric: Mood and affect are normal. Speech and behavior are normal.   ____________________________________________     RADIOLOGY  CT renal scan essentially negative.  ____________________________________________   INITIAL IMPRESSION / ASSESSMENT AND PLAN / ED COURSE  Pertinent labs & imaging results that were available during my care of the patient were reviewed by me and considered in my medical decision making (see chart for details).   Patient presents emergency department for left flank pain since last night.  Differential would include musculoskeletal pain,  radicular pain, ureterolithiasis, UTI pyelonephritis, colitis or diverticulitis.  We will check labs, urinalysis obtain a CT renal scan and continue to closely monitor.  We will treat with normal saline and Toradol.  Patient CT scan is essentially negative.  Work-up is reassuring including urinalysis.  We will treat as musculoskeletal pain we will place the patient on Flexeril the patient follow-up with her doctor.  Patient agreeable to plan of care.  April Welch was evaluated in Emergency Department on 02/15/2021 for the symptoms described in the history of present illness. She was evaluated in the context of the global COVID-19 pandemic, which necessitated consideration that the patient might be at risk for infection with the SARS-CoV-2 virus that causes COVID-19. Institutional protocols and algorithms that pertain to the evaluation of patients at risk for COVID-19 are in a state of rapid change based on information released by regulatory bodies including the CDC and federal and state organizations. These policies and algorithms were followed during the patient's care in the ED.  ____________________________________________   FINAL CLINICAL IMPRESSION(S) / ED DIAGNOSES  Left flank pain   Minna Antis, MD 02/15/21 1046

## 2022-09-24 IMAGING — CT CT RENAL STONE PROTOCOL
2 of 4 series · 17 of 46 positions shown, 19 images · non-contrast
Comparison: None

CLINICAL DATA: Flank and LEFT lower quadrant pain since last night
question kidney stone

EXAM:
CT ABDOMEN AND PELVIS WITHOUT CONTRAST
TECHNIQUE: Multidetector CT imaging of the abdomen and pelvis was performed
following the standard protocol without IV contrast. Sagittal and
coronal MPR images reconstructed from axial data set. No oral
contrast administered.

[Series 2: stone full standard · axial · 0.66mm/px · z∈[-590,-214]mm · 14 of 83 slices shown, 16 images]
[im 4/83  soft-tissue]
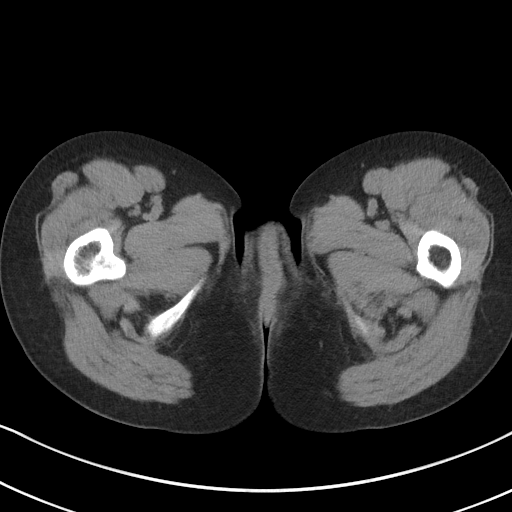
[im 4/83  bone]
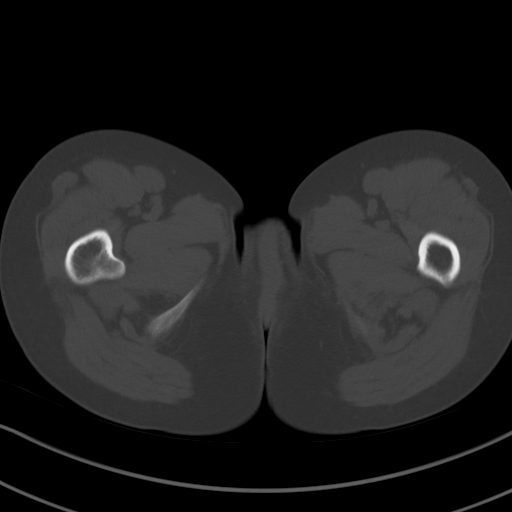
[im 11/83  soft-tissue]
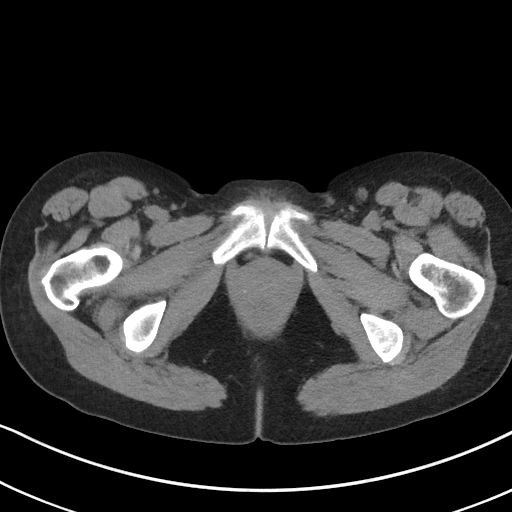
[im 15/83  soft-tissue]
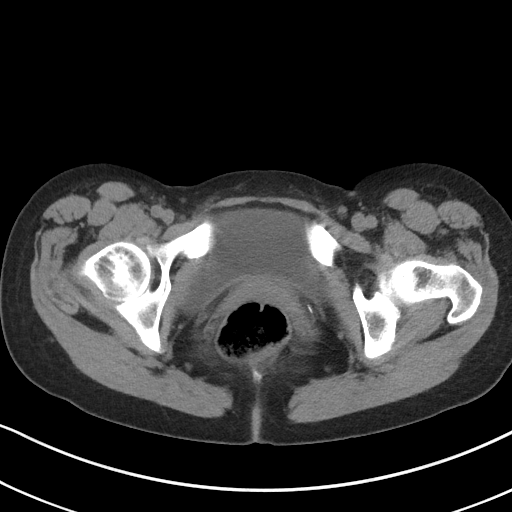
[im 22/83  soft-tissue]
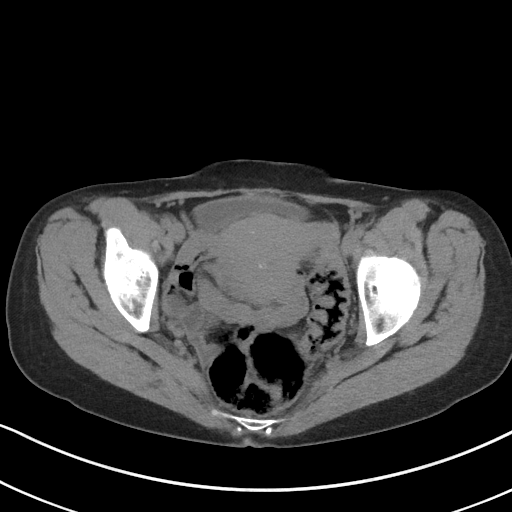
[im 29/83  soft-tissue]
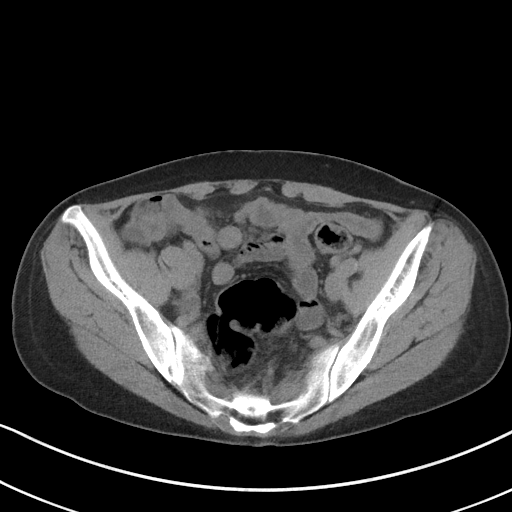
[im 33/83  soft-tissue]
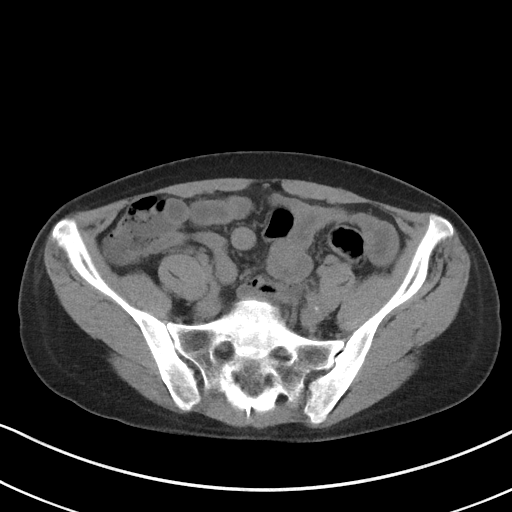
[im 40/83  soft-tissue]
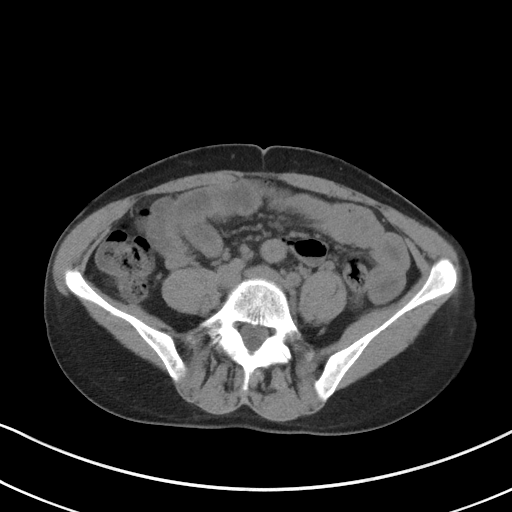
[im 43/83  soft-tissue]
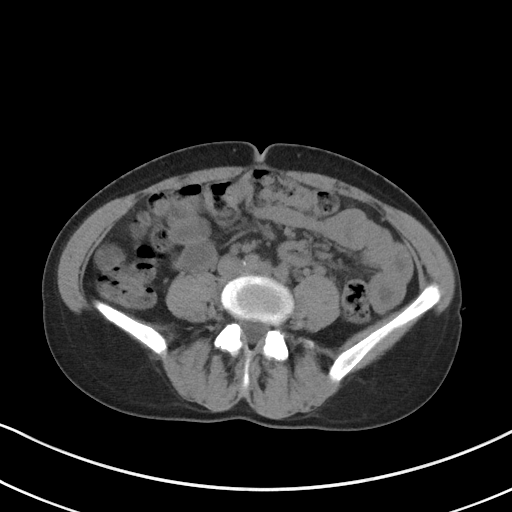
[im 50/83  soft-tissue]
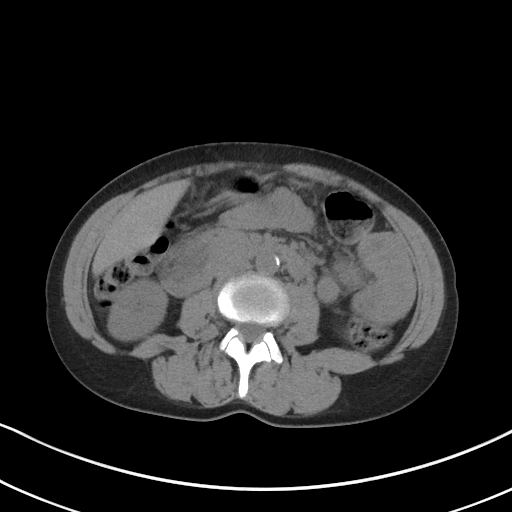
[im 50/83  bone]
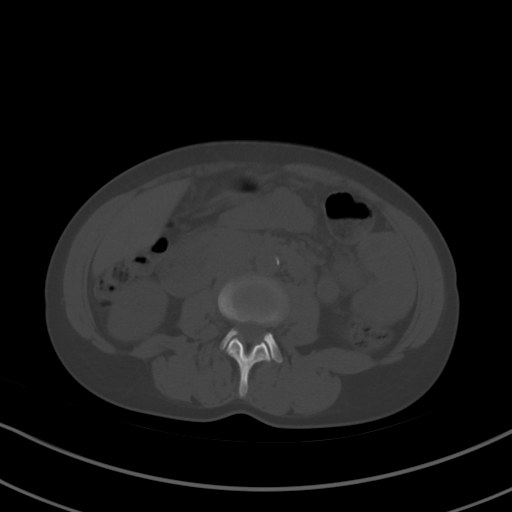
[im 54/83  soft-tissue]
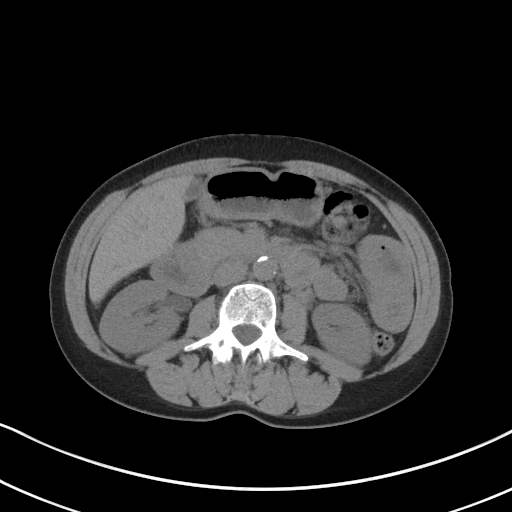
[im 61/83  soft-tissue]
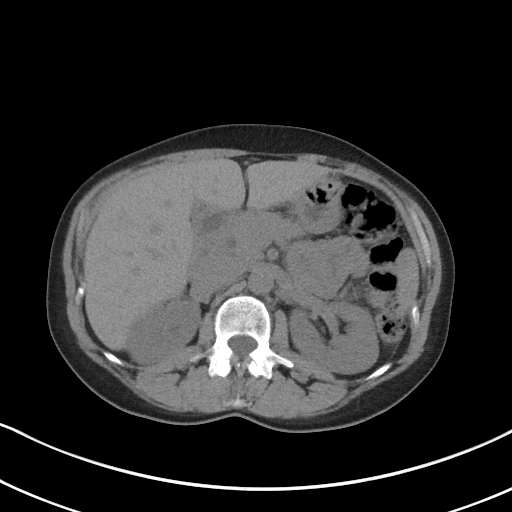
[im 68/83  soft-tissue]
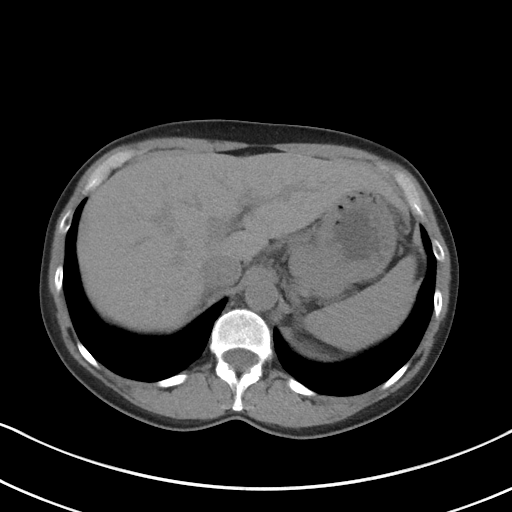
[im 72/83  soft-tissue]
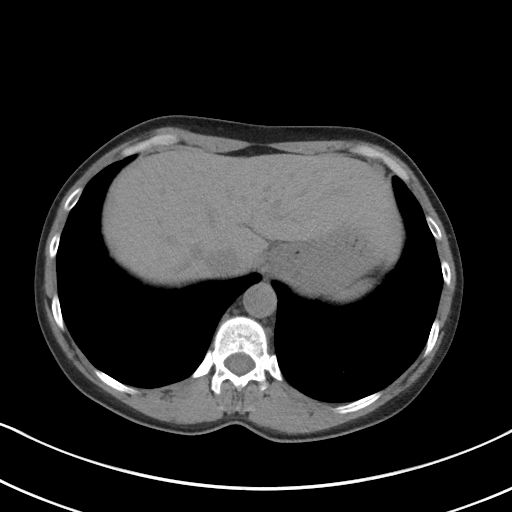
[im 79/83  soft-tissue]
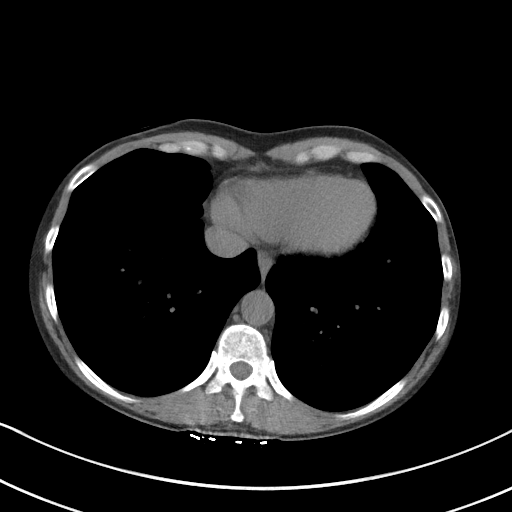

[Series 5: coronal · coronal · 0.70mm/px · 3 of 113 slices shown]
[im 38/113  soft-tissue]
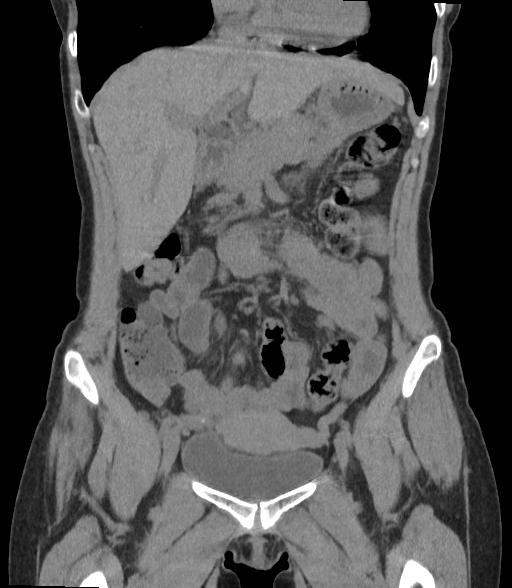
[im 50/113  soft-tissue]
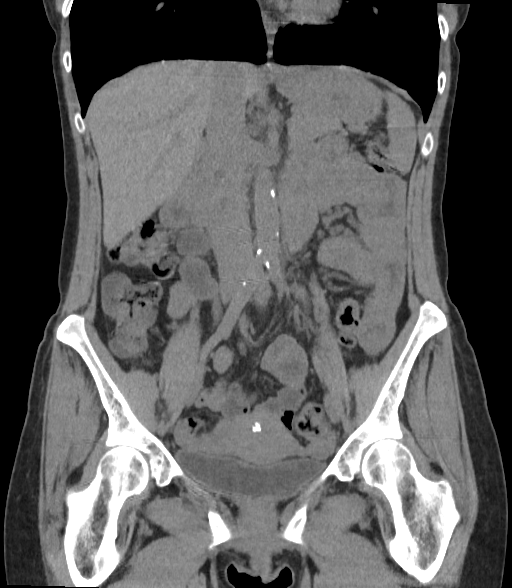
[im 63/113  soft-tissue]
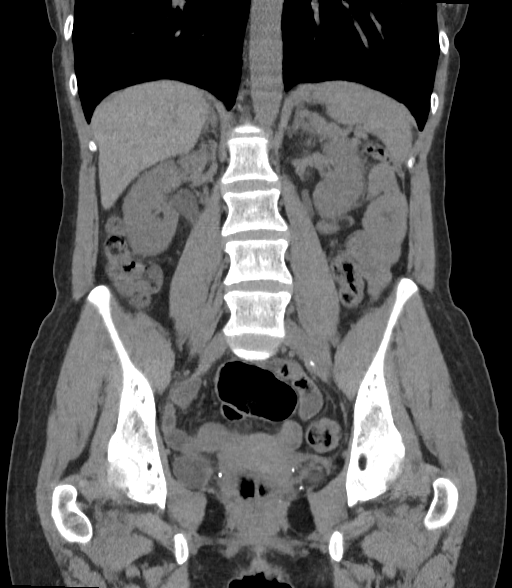

[17 of 46 positions shown; findings below may reference images not displayed]

FINDINGS: Lower chest: Lung bases clear

Hepatobiliary: Small calcified granuloma RIGHT lobe liver. Small
probable cyst RIGHT lobe liver 8 mm diameter. Gallbladder and liver
otherwise unremarkable.

Pancreas: Normal appearance

Spleen: Normal appearance

Adrenals/Urinary Tract: Adrenal glands, kidneys, ureters, and
bladder normal appearance. No urinary tract calcification or
dilatation.

Stomach/Bowel: Stomach unremarkable. Normal appendix. Bowel loops
normal appearance for technique.

Vascular/Lymphatic: Atherosclerotic calcification aorta and iliac
arteries without aneurysm. No adenopathy. Scattered pelvic
phleboliths present.

Reproductive: Unremarkable uterus and adnexa

Other: No free air or free fluid. No hernia or inflammatory process.

Musculoskeletal: Unremarkable
IMPRESSION: No acute intra-abdominal or intrapelvic abnormalities.

Specifically, no evidence of urinary tract calcification or
dilatation.

Aortic Atherosclerosis ([id number]).

## 2022-10-31 ENCOUNTER — Emergency Department
Admission: EM | Admit: 2022-10-31 | Discharge: 2022-10-31 | Disposition: A | Discharge status: Home / Self Care | Payer: Self-pay | Attending: Emergency Medicine | Admitting: Emergency Medicine

## 2022-10-31 ENCOUNTER — Other Ambulatory Visit: Payer: Self-pay

## 2022-10-31 DIAGNOSIS — R21 Rash and other nonspecific skin eruption: Secondary | ICD-10-CM

## 2022-10-31 DIAGNOSIS — B0222 Postherpetic trigeminal neuralgia: Secondary | ICD-10-CM | POA: Insufficient documentation

## 2022-10-31 MED ORDER — ACYCLOVIR 400 MG PO TABS: 400.0000 mg | ORAL_TABLET | Freq: Every day | ORAL | 0 refills | Status: AC

## 2022-10-31 MED ORDER — HYDROCODONE-ACETAMINOPHEN 5-325 MG PO TABS: 1.0000 | ORAL_TABLET | ORAL | 0 refills | Status: AC | PRN

## 2022-10-31 MED ORDER — IBUPROFEN 400 MG PO TABS
400.0000 mg | ORAL_TABLET | Freq: Once | ORAL | Status: AC
  Administered 2022-10-31: 400 mg via ORAL
  Filled 2022-10-31: qty 1

## 2022-10-31 NOTE — Discharge Instructions (Addendum)
Prescription sent to pharmacy.  Apply cold packs or compressions to affected area for relief.  Benadryl can be taken at night to help with itching.  Please consider following up with Dr. Druscilla Brownie, ophthalmology, today or tomorrow for further management.

## 2022-10-31 NOTE — ED Provider Notes (Signed)
[facility] Emergency Department Provider Note     Event Date/Time   First MD Initiated Contact with Patient 10/31/22 1312     (approximate)   History   Rash   HPI  April Welch is a 52 y.o. female who presents to the emergency department complaining of a rash on the right side of her face that appeared a week ago. She reports having shingles in the past and having similar symptoms today.  The rash is severely painful and itchy with an intermittent "burning" sensation.  She notes having headaches prior to onset of rash. The rash has now spread to her scalp only on the right side. No other rash on body noted. Associated symptoms include a green color eye discharge early in the mornings as she wakes up. She denies fever, visual changes, chest pain, shortness of breath.   Physical Exam   Triage Vital Signs: ED Triage Vitals [10/31/22 1227]  Enc Vitals Group     BP (!) 155/77     Pulse Rate 89     Resp 18     Temp 98 F (36.7 C)     Temp src      SpO2 98 %     Weight      Height      Head Circumference      Peak Flow      Pain Score 4     Pain Loc      Pain Edu?      Excl. in GC?     Most recent vital signs: Vitals:   10/31/22 1227 10/31/22 1500  BP: (!) 155/77 122/88  Pulse: 89 64  Resp: 18 18  Temp: 98 F (36.7 C)   SpO2: 98% 98%    General Awake, no distress.  Alert HEENT scalp on right side reveals 2-3 erythematous papules, tender to palpate.  Conjunctiva is clear, sclera is white. No eye discharge. PERRLA. NCAT. EOMI.  CV:  Good peripheral perfusion.  RESP:  Normal effort.  ABD:  No distention.  Other:  There is a unilateral erythematous plaque like lesion on the face V1 distribution of the face, does not cross midline, tender to palpate. CN II-XII intact.    ED Results / Procedures / Treatments   Procedures   MEDICATIONS ORDERED IN ED: Medications  ibuprofen (ADVIL) tablet 400 mg (400 mg Oral Given 10/31/22 1349)     IMPRESSION / MDM / ASSESSMENT AND PLAN / ED COURSE  I reviewed the triage vital signs and the nursing notes.                              Differential diagnosis includes, but is not limited to, shingles, herpes zoster ophthalmicus, contact dermatitis, poison oak, burn Patient's presentation is most consistent with acute illness / injury with system symptoms.  52 year old female presents with a rash on V1 dermatome distribution of her face.  She has had similar symptoms in the past in which she was treated for shingles.  The distribution of this rash is concerning for eye involvement.   She was given Ibuprofen in the ED for pain.     I consulted with ophthalmology, Dr. Druscilla Brownie for further evaluation. He has agreed to see the patient in his office today or tomorrow.  I discussed this with the patient and how critical it is to make an appointment. The patient declined continuing her care further  in visiting an ophthalmologist due to financial concerns. Patient is requesting to leave with medications needed for her symptoms.   In my opinion, the patient has capacity to leave AMA. The patient is clinically sober, free from distracting injury, appears to have intact insight and judgment and reason, and in my opinion has capacity to make decisions. The patient has no caregiver and is independently seen. I explained to the patient that her symptoms may represent clinically significant vision loss if not evaluated by a specialist. The patient verbalized understanding of my concerns.   Patient is reporting her symptoms are not bad enough to see an ophthalmologist and have since improved since initial onset. She said the ibuprofen helped some with her pain.   I have the patient to return as soon as possible for evaluation if symptoms worsened.  I have answered all their questions.   Patient's diagnosis is most consistent with shingles. Patient will be discharged home with prescriptions for an  antiviral and pain medication. Patient is to follow up with ophthalmology. Patient is given ED precautions to return to the ED for any worsening or new symptoms.  Patient verbalizes understanding of diagnosis and treatment plan.     FINAL CLINICAL IMPRESSION(S) / ED DIAGNOSES   Final diagnoses:  Trigeminal herpes zoster  Rash     Rx / DC Orders   ED Discharge Orders          Ordered    acyclovir (ZOVIRAX) 400 MG tablet  5 times daily        10/31/22 1445    HYDROcodone-acetaminophen (NORCO) 5-325 MG tablet  Every 4 hours PRN        10/31/22 1445             Note:  This document was prepared using Dragon voice recognition software and may include unintentional dictation errors.    Romeo Apple, Misaki Sozio A, PA-C 10/31/22 1736    Jene Every, MD 11/02/22 [phone]

## 2022-10-31 NOTE — ED Triage Notes (Signed)
Pt comes with c/o possible shingles. Pt states the rash started on the right side top of head week ago. Pt states it has now traveled to front over right eye and side of face. Pt states painful and does itch.
